# Patient Record
Sex: Male | Born: 1975 | Race: White | Hispanic: Yes | Marital: Single | Smoking: Current every day smoker
Health system: Southern US, Community
[De-identification: ages and names within clinical notes are randomized; demographics above are authoritative.]

## PROBLEM LIST (undated history)

## (undated) DIAGNOSIS — Z9289 Personal history of other medical treatment: Secondary | ICD-10-CM

## (undated) DIAGNOSIS — W3400XA Accidental discharge from unspecified firearms or gun, initial encounter: Secondary | ICD-10-CM

## (undated) DIAGNOSIS — F419 Anxiety disorder, unspecified: Secondary | ICD-10-CM

## (undated) DIAGNOSIS — N390 Urinary tract infection, site not specified: Secondary | ICD-10-CM

---

## 1999-06-25 ENCOUNTER — Ambulatory Visit (HOSPITAL_COMMUNITY): Admission: RE | Admit: 1999-06-25 | Discharge: 1999-06-25 | Payer: Self-pay | Admitting: Urology

## 1999-06-25 ENCOUNTER — Encounter: Payer: Self-pay | Admitting: Urology

## 2001-04-02 ENCOUNTER — Ambulatory Visit (HOSPITAL_COMMUNITY): Admission: RE | Admit: 2001-04-02 | Discharge: 2001-04-02 | Payer: Self-pay | Admitting: Family Medicine

## 2001-04-02 ENCOUNTER — Encounter: Payer: Self-pay | Admitting: Family Medicine

## 2003-09-04 ENCOUNTER — Encounter: Payer: Self-pay | Admitting: Emergency Medicine

## 2003-09-04 ENCOUNTER — Emergency Department (HOSPITAL_COMMUNITY): Admission: EM | Admit: 2003-09-04 | Discharge: 2003-09-04 | Payer: Self-pay | Admitting: Emergency Medicine

## 2009-08-07 ENCOUNTER — Emergency Department (HOSPITAL_COMMUNITY): Admission: EM | Admit: 2009-08-07 | Discharge: 2009-08-07 | Payer: Self-pay | Admitting: Emergency Medicine

## 2009-12-30 DIAGNOSIS — W3400XA Accidental discharge from unspecified firearms or gun, initial encounter: Secondary | ICD-10-CM

## 2009-12-30 DIAGNOSIS — Z9289 Personal history of other medical treatment: Secondary | ICD-10-CM

## 2009-12-30 HISTORY — DX: Personal history of other medical treatment: Z92.89

## 2009-12-30 HISTORY — PX: SMALL INTESTINE SURGERY: SHX150

## 2009-12-30 HISTORY — DX: Accidental discharge from unspecified firearms or gun, initial encounter: W34.00XA

## 2010-06-29 ENCOUNTER — Encounter (INDEPENDENT_AMBULATORY_CARE_PROVIDER_SITE_OTHER): Payer: Self-pay

## 2010-06-29 ENCOUNTER — Inpatient Hospital Stay (HOSPITAL_COMMUNITY): Admission: AC | Admit: 2010-06-29 | Discharge: 2010-07-09 | Payer: Self-pay

## 2010-09-19 ENCOUNTER — Emergency Department (HOSPITAL_COMMUNITY): Admission: EM | Admit: 2010-09-19 | Discharge: 2010-09-19 | Payer: Self-pay | Admitting: Emergency Medicine

## 2011-03-14 LAB — URINALYSIS, ROUTINE W REFLEX MICROSCOPIC
Nitrite: NEGATIVE
Protein, ur: NEGATIVE mg/dL
pH: 5 (ref 5.0–8.0)

## 2011-03-17 LAB — TYPE AND SCREEN
ABO/RH(D): O POS
Antibody Screen: NEGATIVE

## 2011-03-17 LAB — POCT I-STAT 7, (LYTES, BLD GAS, ICA,H+H)
Bicarbonate: 23.9 meq/L (ref 20.0–24.0)
Calcium, Ion: 1.07 mmol/L — ABNORMAL LOW (ref 1.12–1.32)
Hemoglobin: 13.3 g/dL (ref 13.0–17.0)
Potassium: 2.8 meq/L — ABNORMAL LOW (ref 3.5–5.1)
Sodium: 140 meq/L (ref 135–145)
TCO2: 25 mmol/L (ref 0–100)
pCO2 arterial: 48.9 mmHg — ABNORMAL HIGH (ref 35.0–45.0)
pH, Arterial: 7.295 — ABNORMAL LOW (ref 7.350–7.450)

## 2011-03-17 LAB — BLOOD GAS, ARTERIAL
Drawn by: 32752
FIO2: 100 %
Patient temperature: 98.6
TCO2: 27.2 mmol/L (ref 0–100)
pH, Arterial: 7.392 (ref 7.350–7.450)

## 2011-03-17 LAB — CBC
HCT: 30.6 % — ABNORMAL LOW (ref 39.0–52.0)
HCT: 31.5 % — ABNORMAL LOW (ref 39.0–52.0)
HCT: 34.3 % — ABNORMAL LOW (ref 39.0–52.0)
HCT: 37 % — ABNORMAL LOW (ref 39.0–52.0)
HCT: 45.2 % (ref 39.0–52.0)
Hemoglobin: 10.4 g/dL — ABNORMAL LOW (ref 13.0–17.0)
Hemoglobin: 11.6 g/dL — ABNORMAL LOW (ref 13.0–17.0)
Hemoglobin: 12.9 g/dL — ABNORMAL LOW (ref 13.0–17.0)
Hemoglobin: 14.3 g/dL (ref 13.0–17.0)
MCH: 31.7 pg (ref 26.0–34.0)
MCH: 32.6 pg (ref 26.0–34.0)
MCHC: 34.8 g/dL (ref 30.0–36.0)
MCV: 91.2 fL (ref 78.0–100.0)
MCV: 92.4 fL (ref 78.0–100.0)
MCV: 92.9 fL (ref 78.0–100.0)
MCV: 93.4 fL (ref 78.0–100.0)
Platelets: 172 10*3/uL (ref 150–400)
Platelets: 242 10*3/uL (ref 150–400)
Platelets: 296 10*3/uL (ref 150–400)
RBC: 4.37 MIL/uL (ref 4.22–5.81)
RDW: 12.3 % (ref 11.5–15.5)
RDW: 12.4 % (ref 11.5–15.5)
RDW: 12.5 % (ref 11.5–15.5)
RDW: 12.9 % (ref 11.5–15.5)
WBC: 12.7 10*3/uL — ABNORMAL HIGH (ref 4.0–10.5)
WBC: 14.4 10*3/uL — ABNORMAL HIGH (ref 4.0–10.5)
WBC: 4 10*3/uL (ref 4.0–10.5)
WBC: 6.9 10*3/uL (ref 4.0–10.5)
WBC: 7.7 10*3/uL (ref 4.0–10.5)
WBC: 7.9 10*3/uL (ref 4.0–10.5)

## 2011-03-17 LAB — COMPREHENSIVE METABOLIC PANEL
AST: 41 U/L — ABNORMAL HIGH (ref 0–37)
BUN: 15 mg/dL (ref 6–23)
CO2: 19 mEq/L (ref 19–32)
Calcium: 8.4 mg/dL (ref 8.4–10.5)
Glucose, Bld: 232 mg/dL — ABNORMAL HIGH (ref 70–99)
Total Protein: 6.4 g/dL (ref 6.0–8.3)

## 2011-03-17 LAB — BASIC METABOLIC PANEL
BUN: 14 mg/dL (ref 6–23)
BUN: 6 mg/dL (ref 6–23)
BUN: 7 mg/dL (ref 6–23)
BUN: 7 mg/dL (ref 6–23)
BUN: 9 mg/dL (ref 6–23)
CO2: 19 mEq/L (ref 19–32)
CO2: 27 mEq/L (ref 19–32)
Calcium: 7.6 mg/dL — ABNORMAL LOW (ref 8.4–10.5)
Calcium: 8.6 mg/dL (ref 8.4–10.5)
Chloride: 107 mEq/L (ref 96–112)
Chloride: 108 mEq/L (ref 96–112)
Chloride: 95 mEq/L — ABNORMAL LOW (ref 96–112)
Creatinine, Ser: 0.53 mg/dL (ref 0.4–1.5)
Creatinine, Ser: 0.67 mg/dL (ref 0.4–1.5)
Creatinine, Ser: 0.68 mg/dL (ref 0.4–1.5)
Creatinine, Ser: 0.92 mg/dL (ref 0.4–1.5)
GFR calc non Af Amer: >60 mL/min (ref >60)
GFR calc non Af Amer: >60 mL/min (ref >60)
GFR calc non Af Amer: >60 mL/min (ref >60)
GFR calc non Af Amer: >60 mL/min (ref >60)
Glucose, Bld: 111 mg/dL — ABNORMAL HIGH (ref 70–99)
Potassium: 3.4 mEq/L — ABNORMAL LOW (ref 3.5–5.1)
Potassium: 3.8 mEq/L (ref 3.5–5.1)
Potassium: 3.9 mEq/L (ref 3.5–5.1)
Potassium: 4.3 mEq/L (ref 3.5–5.1)
Sodium: 132 mEq/L — ABNORMAL LOW (ref 135–145)
Sodium: 138 mEq/L (ref 135–145)

## 2011-03-17 LAB — POCT I-STAT 3, ART BLOOD GAS (G3+)
Acid-base deficit: 6 mmol/L — ABNORMAL HIGH (ref 0.0–2.0)
Bicarbonate: 14.5 meq/L — ABNORMAL LOW (ref 20.0–24.0)
O2 Saturation: 100 %
Patient temperature: 97.8
Patient temperature: 98.8
TCO2: 21 mmol/L (ref 0–100)
pCO2 arterial: 26.3 mmHg — ABNORMAL LOW (ref 35.0–45.0)

## 2011-03-17 LAB — URINALYSIS, MICROSCOPIC ONLY
Glucose, UA: NEGATIVE mg/dL
Specific Gravity, Urine: 1.027 (ref 1.005–1.030)
Urobilinogen, UA: 0.2 mg/dL (ref 0.0–1.0)
pH: 5.5 (ref 5.0–8.0)

## 2011-03-17 LAB — POCT I-STAT, CHEM 8
Calcium, Ion: 0.99 mmol/L — ABNORMAL LOW (ref 1.12–1.32)
Creatinine, Ser: 1.3 mg/dL (ref 0.4–1.5)
HCT: 47 % (ref 39.0–52.0)
Hemoglobin: 16 g/dL (ref 13.0–17.0)
Potassium: 2.5 meq/L — CL (ref 3.5–5.1)
TCO2: 17 mmol/L (ref 0–100)

## 2011-03-17 LAB — PROTIME-INR
INR: 1.01 (ref 0.00–1.49)
Prothrombin Time: 13.2 seconds (ref 11.6–15.2)

## 2011-03-17 LAB — DIFFERENTIAL
Basophils Absolute: 0 10*3/uL (ref 0.0–0.1)
Basophils Absolute: 0.1 10*3/uL (ref 0.0–0.1)
Eosinophils Relative: 3 % (ref 0–5)
Lymphocytes Relative: 11 % — ABNORMAL LOW (ref 12–46)
Lymphocytes Relative: 17 % (ref 12–46)
Lymphs Abs: 2.2 10*3/uL (ref 0.7–4.0)
Monocytes Absolute: 1.1 10*3/uL — ABNORMAL HIGH (ref 0.1–1.0)
Monocytes Absolute: 1.1 10*3/uL — ABNORMAL HIGH (ref 0.1–1.0)
Monocytes Relative: 8 % (ref 3–12)
Monocytes Relative: 9 % (ref 3–12)
Neutro Abs: 11.3 10*3/uL — ABNORMAL HIGH (ref 1.7–7.7)

## 2011-03-17 LAB — ABO/RH: ABO/RH(D): O POS

## 2011-03-17 LAB — POCT I-STAT GLUCOSE: Glucose, Bld: 201 mg/dL — ABNORMAL HIGH (ref 70–99)

## 2011-03-17 LAB — APTT: aPTT: 26 seconds (ref 24–37)

## 2011-04-06 LAB — URINALYSIS, ROUTINE W REFLEX MICROSCOPIC
Bilirubin Urine: NEGATIVE
Hgb urine dipstick: NEGATIVE
Ketones, ur: NEGATIVE mg/dL
Nitrite: NEGATIVE
Urobilinogen, UA: 0.2 mg/dL (ref 0.0–1.0)

## 2012-02-02 ENCOUNTER — Emergency Department (HOSPITAL_COMMUNITY)
Admission: EM | Admit: 2012-02-02 | Discharge: 2012-02-02 | Disposition: A | Discharge status: Home / Self Care | Payer: Self-pay | Attending: Emergency Medicine | Admitting: Emergency Medicine

## 2012-02-02 ENCOUNTER — Encounter (HOSPITAL_COMMUNITY): Payer: Self-pay

## 2012-02-02 DIAGNOSIS — R3915 Urgency of urination: Secondary | ICD-10-CM | POA: Insufficient documentation

## 2012-02-02 DIAGNOSIS — R109 Unspecified abdominal pain: Secondary | ICD-10-CM | POA: Insufficient documentation

## 2012-02-02 DIAGNOSIS — M549 Dorsalgia, unspecified: Secondary | ICD-10-CM | POA: Insufficient documentation

## 2012-02-02 DIAGNOSIS — N419 Inflammatory disease of prostate, unspecified: Secondary | ICD-10-CM | POA: Insufficient documentation

## 2012-02-02 DIAGNOSIS — R350 Frequency of micturition: Secondary | ICD-10-CM | POA: Insufficient documentation

## 2012-02-02 DIAGNOSIS — R300 Dysuria: Secondary | ICD-10-CM | POA: Insufficient documentation

## 2012-02-02 HISTORY — DX: Urinary tract infection, site not specified: N39.0

## 2012-02-02 HISTORY — DX: Accidental discharge from unspecified firearms or gun, initial encounter: W34.00XA

## 2012-02-02 LAB — URINALYSIS, ROUTINE W REFLEX MICROSCOPIC
Bilirubin Urine: NEGATIVE
Glucose, UA: NEGATIVE mg/dL
Ketones, ur: 40 mg/dL — AB
Leukocytes, UA: NEGATIVE
Nitrite: NEGATIVE
Specific Gravity, Urine: 1.023 (ref 1.005–1.030)
pH: 6.5 (ref 5.0–8.0)

## 2012-02-02 MED ORDER — DOXYCYCLINE HYCLATE 100 MG PO CAPS: 100.0000 mg | ORAL_CAPSULE | Freq: Two times a day (BID) | ORAL | Status: AC
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
## 2012-02-02 NOTE — ED Notes (Signed)
Pt with c/o urinary freq, center of back pain, hx of UTIs in the past

## 2012-02-03 LAB — GC/CHLAMYDIA PROBE AMP, GENITAL
Chlamydia, DNA Probe: NEGATIVE
GC Probe Amp, Genital: NEGATIVE

## 2014-04-09 ENCOUNTER — Emergency Department (HOSPITAL_COMMUNITY)
Admission: EM | Admit: 2014-04-09 | Discharge: 2014-04-09 | Disposition: A | Discharge status: Home / Self Care | Payer: BC Managed Care – PPO | Attending: Emergency Medicine | Admitting: Emergency Medicine

## 2014-04-09 ENCOUNTER — Encounter (HOSPITAL_COMMUNITY): Payer: Self-pay | Admitting: Emergency Medicine

## 2014-04-09 DIAGNOSIS — R519 Headache, unspecified: Secondary | ICD-10-CM

## 2014-04-09 DIAGNOSIS — R1012 Left upper quadrant pain: Secondary | ICD-10-CM | POA: Insufficient documentation

## 2014-04-09 DIAGNOSIS — Z87440 Personal history of urinary (tract) infections: Secondary | ICD-10-CM | POA: Insufficient documentation

## 2014-04-09 DIAGNOSIS — R51 Headache: Secondary | ICD-10-CM | POA: Insufficient documentation

## 2014-04-09 DIAGNOSIS — R110 Nausea: Secondary | ICD-10-CM | POA: Insufficient documentation

## 2014-04-09 DIAGNOSIS — F17200 Nicotine dependence, unspecified, uncomplicated: Secondary | ICD-10-CM | POA: Insufficient documentation

## 2014-04-09 DIAGNOSIS — Z9889 Other specified postprocedural states: Secondary | ICD-10-CM | POA: Insufficient documentation

## 2014-04-09 LAB — COMPREHENSIVE METABOLIC PANEL
ALT: 13 U/L (ref 0–53)
AST: 14 U/L (ref 0–37)
Albumin: 4.1 g/dL (ref 3.5–5.2)
Alkaline Phosphatase: 138 U/L — ABNORMAL HIGH (ref 39–117)
BILIRUBIN TOTAL: 0.6 mg/dL (ref 0.3–1.2)
BUN: 12 mg/dL (ref 6–23)
CHLORIDE: 102 meq/L (ref 96–112)
CO2: 24 meq/L (ref 19–32)
Calcium: 8.8 mg/dL (ref 8.4–10.5)
Creatinine, Ser: 0.73 mg/dL (ref 0.50–1.35)
GFR calc Af Amer: >90 mL/min (ref >90)
Glucose, Bld: 88 mg/dL (ref 70–99)
Potassium: 3.8 mEq/L (ref 3.7–5.3)
Sodium: 141 mEq/L (ref 137–147)
Total Protein: 7.4 g/dL (ref 6.0–8.3)

## 2014-04-09 LAB — CBC WITH DIFFERENTIAL/PLATELET
Basophils Absolute: 0 10*3/uL (ref 0.0–0.1)
Basophils Relative: 0 % (ref 0–1)
Eosinophils Absolute: 0.5 10*3/uL (ref 0.0–0.7)
Eosinophils Relative: 5 % (ref 0–5)
HEMATOCRIT: 43.4 % (ref 39.0–52.0)
Hemoglobin: 15.8 g/dL (ref 13.0–17.0)
LYMPHS PCT: 27 % (ref 12–46)
Lymphs Abs: 2.5 10*3/uL (ref 0.7–4.0)
MCH: 31.8 pg (ref 26.0–34.0)
MCHC: 36.4 g/dL — AB (ref 30.0–36.0)
MCV: 87.3 fL (ref 78.0–100.0)
MONO ABS: 0.7 10*3/uL (ref 0.1–1.0)
Monocytes Relative: 8 % (ref 3–12)
NEUTROS ABS: 5.5 10*3/uL (ref 1.7–7.7)
Neutrophils Relative %: 60 % (ref 43–77)
Platelets: 265 10*3/uL (ref 150–400)
RBC: 4.97 MIL/uL (ref 4.22–5.81)
RDW: 12.4 % (ref 11.5–15.5)
WBC: 9.2 10*3/uL (ref 4.0–10.5)

## 2014-04-09 LAB — LIPASE, BLOOD: LIPASE: 19 U/L (ref 11–59)

## 2014-04-09 MED ORDER — SODIUM CHLORIDE 0.9 % IV BOLUS (SEPSIS)
1000.0000 mL | Freq: Once | INTRAVENOUS | Status: AC
  Administered 2014-04-09: 1000 mL via INTRAVENOUS

## 2014-04-09 MED ORDER — METOCLOPRAMIDE HCL 5 MG/ML IJ SOLN
10.0000 mg | Freq: Once | INTRAMUSCULAR | Status: AC
  Administered 2014-04-09: 10 mg via INTRAVENOUS
  Filled 2014-04-09: qty 2

## 2014-04-09 MED ORDER — DIPHENHYDRAMINE HCL 50 MG/ML IJ SOLN
25.0000 mg | Freq: Once | INTRAMUSCULAR | Status: AC
  Administered 2014-04-09: 25 mg via INTRAVENOUS
  Filled 2014-04-09: qty 1

## 2014-04-09 MED ORDER — KETOROLAC TROMETHAMINE 30 MG/ML IJ SOLN
30.0000 mg | Freq: Once | INTRAMUSCULAR | Status: AC
  Administered 2014-04-09: 30 mg via INTRAVENOUS
  Filled 2014-04-09: qty 1

## 2014-04-09 NOTE — ED Notes (Signed)
Use of interpreter phone service. Pt c/o 15 days ago with a cold. Pain to sinus area and right side of head and in the front. Pain in abdomen area that occurs after eating. Reports feeling like he is bloated, ongoing for a long time. Pt denies nausea or vomiting.

## 2014-04-09 NOTE — ED Notes (Signed)
Pt states sinus pressure and cold symptoms, also states headache. C/o diffuse abdominal pain after eating. 3/10 pain at the time. Pt is alert and oriented x4. Respirations unlabored. Pt does not speak english, interpreter phone used to perform assessment. Ambulatory to exam room.

## 2014-04-09 NOTE — ED Notes (Signed)
Pt states he feels much better. Denies pain at the time. Pt is alert and oriented x4. No signs of distress noted.

## 2014-04-09 NOTE — Discharge Instructions (Signed)
Please use resources below to find a primary care provider for further management of your medical condition.  Return to ER if your symptoms worsen or if you have other concerns.   Dolor abdominal en adultos (Abdominal Pain, Adult) El dolor puede tener muchas causas. Normalmente la causa del dolor abdominal no es una enfermedad y Scientist, clinical (histocompatibility and immunogenetics) sin TEFL teacher. Frecuentemente puede controlarse y tratarse en casa. Su mdico le Medical sales representative examen fsico y posiblemente solicite anlisis de sangre y radiografas para ayudar a Chief Strategy Officer la gravedad de su dolor. Sin embargo, en [employer], debe transcurrir ms tiempo antes de que se pueda Clinical research associate una causa evidente del dolor. Antes de llegar a ese punto, es posible que su mdico no sepa si necesita ms pruebas o un tratamiento ms profundo. INSTRUCCIONES PARA EL CUIDADO EN EL HOGAR  Est atento al dolor para ver si hay cambios. Las siguientes indicaciones ayudarn a Architectural technologist que pueda sentir:  [place] solo medicamentos de venta libre o recetados, segn las indicaciones del mdico.  No tome laxantes a menos que se lo haya indicado su mdico.  Pruebe con Neomia Dear dieta lquida absoluta (caldo, t o agua) segn se lo indique su mdico. Introduzca gradualmente una dieta normal, segn su tolerancia. SOLICITE ATENCIN MDICA SI:  Tiene dolor abdominal sin explicacin.  Tiene dolor abdominal relacionado con nuseas o diarrea.  Tiene dolor cuando orina o defeca.  Experimenta dolor abdominal que lo despierta de noche.  Tiene dolor abdominal que empeora o mejora cuando come alimentos.  Tiene dolor abdominal que empeora cuando come alimentos grasosos. SOLICITE ATENCIN MDICA DE INMEDIATO SI:   El dolor no desaparece en un plazo mximo de 2horas.  Tiene fiebre.  No deja de (vomitar).  El Engineer, mining se siente solo en partes del abdomen, como el lado derecho o la parte inferior izquierda del abdomen.  Evaca materia fecal sanguinolenta o negra,  de aspecto alquitranado. ASEGRESE DE QUE:  Comprende estas instrucciones.  Controlar su afeccin.  Recibir ayuda de inmediato si no mejora o si empeora. Document Released: 12/16/2005 Document Revised: 10/06/2013 [facility] Patient Information 2014 [place], [place].   Emergency Department Resource Guide 1) Find a Doctor and Pay Out of Pocket Although you won't have to find out who is covered by your insurance plan, it is a good idea to ask around and get recommendations. You will then need to call the office and see if the doctor you have chosen will accept you as a new patient and what types of options they offer for patients who are self-pay. Some doctors offer discounts or will set up payment plans for their patients who do not have insurance, but you will need to ask so you aren't surprised when you get to your appointment.  2) Contact Your Local Health Department Not all health departments have doctors that can see patients for sick visits, but many do, so it is worth a call to see if yours does. If you don't know where your local health department is, you can check in your phone book. The CDC also has a tool to help you locate your state's health department, and many state websites also have listings of all of their local health departments.  3) Find a Walk-in Clinic If your illness is not likely to be very severe or complicated, you may want to try a walk in clinic. These are popping up all over the country in pharmacies, drugstores, and shopping centers. They're usually staffed by nurse practitioners or physician assistants that  have been trained to treat common illnesses and complaints. They're usually fairly quick and inexpensive. However, if you have serious medical issues or chronic medical problems, these are probably not your best option.  No Primary Care Doctor: - Call Health Connect at  [phone] - they can help you locate a primary care doctor that  accepts your insurance,  provides certain services, etc. - Physician Referral Service- [phone]  Chronic Pain Problems: Organization         Address  Phone   Notes  [facility]  [phone] Patients need to be referred by their primary care doctor.   Medication Assistance: Organization         Address  Phone   Notes  [facility] Medication [facility] [address] [place] [place], [place] [address] [phone] --Must be a resident of [facility] -- Must have NO insurance coverage whatsoever (no Medicaid/ Medicare, etc.) -- The pt. MUST have a primary care doctor that directs their care regularly and follows them in the community   Med[facility]ist  [phone]   [employer]  [phone]    Agencies that provide inexpensive medical care: Organization         Address  Phone   Notes  [facility]  [phone]   [facility]    [phone]   [facility] [address] [place], [place] [address] [phone]   [facility] [address] [place] [place] [phone]   Planned Parenthood    [phone]   [facility]    [phone]   [facility]  [address], [place] Phone:  [phone], Fax:  [phone] Hours of Operation:  9 am - 6 pm, M-F.  Also accepts Medicaid/Medicare and self-pay.  [facility]  [address], [place] Phone: [phone], Fax: [phone]. Hours of Operation:  8:30 am - 5:30 pm, M-F.  Also accepts Medicaid and self-pay.  [facility] [place] [address], [place] Phone: [phone]   [facility] [address] Natasha Bence [place], [place] [phone], Ext. [phone] Mondays & Thursdays: 7-9 AM.  First 15 patients are seen on a first come, first serve basis.    Medicaid-accepting [facility] Providers:  Organization         Address  Phone    Notes  [facility] [address], [place] [phone] Also accepts self-pay patients.  [facility] [address] Laurell Josephs [place], [place]  [phone]   [facility] [address], [place] [phone]   [facility] [address], [place] [phone]   Renaye Rakers [address], [place]   [phone] Only accepts [place] Access [place] patients after they have their name applied to their card.   Self-Pay (no insurance) in [facility]:  Organization         Address  Phone   Notes  Sickle Cell Patients, [facility] [address] [place], [place] [phone]   [facility] [address] [place], [place] [phone]   [facility] [place]  [address], [place] [phone]   [facility]/Dr. Osei-Bonsu  [address] [place] [address], [place]  or [address], [address], [place] [phone] Phone number for both [facility] and [place] locations is the same.  [facility]gent Medical and [facility] [address], [place] [phone]   [facility] [address], [place] or [address] [phone]   [facility] [address] [place] [phone], phone; [phone], fax Sees patients 1st and 3rd Saturday of every month.  Must not qualify for public or private insurance (i.e. Medicaid, Medicare, [place] Health Choice, Veterans' Benefits)  Household income should be no more than 200% of the poverty level The clinic cannot treat you if you are pregnant or think you are pregnant  Sexually transmitted diseases are not treated at the clinic.    Dental Care: Organization         Address  Phone  Notes  [facility] Department of [facility] [address] [place], [place] [phone] Accepts children up to age 51 who are enrolled in [place] or [place] Health Choice; pregnant women with a Medicaid card; and children who have applied for Medicaid or [place] Health Choice, but were declined, whose parents can pay a reduced fee at time of service.  [facility] Department of [facility]  [address], [place] [phone] Accepts children up to age 19 who are enrolled in [place] or [place] Health Choice; pregnant women with a Medicaid card; and children who have applied for Medicaid or [place] Health Choice, but were declined, whose parents can pay a reduced fee at time of service.  [facility]  [address] [place], [place] [phone] Patients are seen by appointment only. Walk-ins are not accepted. [facility] will see patients 75 years of age and older. Monday - Tuesday (8am-5pm) Most Wednesdays (8:30-5pm) $30 per visit, cash only  [facility] Adult Dental Access PRO[facility]  [address], [facility] [phone] Patients are seen by appointment only. Walk-ins are not accepted. [facility] will see patients 75 years of age and older. One Wednesday Evening (Monthly: Volunteer Based).  $30 per visit, cash only  [employer]  [phone] for adults; Children under age 63, call Graduate Pediatric Dentistry at [phone]. Children aged 24-14, please call [phone] to request a pediatric application.  Dental services are provided in all areas of dental care including fillings, crowns and bridges, complete and partial dentures, implants, gum treatment, root canals, and extractions. Preventive care is also provided. Treatment is provided to both adults and children. Patients are selected via a lottery and there is often a waiting list.   [facility] [address], [place]  [phone] [email or web address]   [facility] [address] [place], [place] [phone], Ext.  [phone] Second and Fourth Thursday of each month, opens at 6:30 AM; Clinic ends at 9 AM.  Patients are seen on a first-come first-served basis, and a limited number are seen during each clinic.   [facility]  [address] Ether Griffins [place], [place] [phone]   Eligibility Requirements You must have lived in [place], or [place] counties for at least the last three months.   You cannot be eligible for state or federal sponsored [place], including [employer], [place], or [employer].   You generally cannot be eligible for healthcare insurance through your employer.    How to apply: Eligibility  screenings are held every Tuesday and Wednesday afternoon from 1:00 pm until 4:00 pm. You do not need an appointment for the interview!  [facility] [address] [place] [address], [place], [place] [phone]   [facility]  [phone]   [facility]  [phone]   [facility]  [phone]    Behavioral Health Resources in the Community: Intensive Outpatient Programs Organization         Address  Phone  Notes  [facility] [address], [place], [place] [phone]   [facility] [address], [place], [place] [phone]   [facility] [address], [place], [place]  [phone]   [facility] [address],  [place], [place] [phone] or [phone]   Substance Abuse Resources Organization         Address  Phone  Notes  [facility]  [phone]   [facility]  [phone]   [facility]  [phone]   [facility]  [phone]   Residential & Outpatient Substance Abuse Program  [phone]   Psychological Services Organization         Address  Phone  Notes  [facility]  [phone]   [facility]  [phone]- [phone]   [facility]ental [facility] [address], [place] [phone] or [phone]    Mobile Crisis Teams Organization         Address  Phone  Notes  [facility], Mobile Crisis Care Unit  [phone]   [facility]  [address] [place], [place] [phone]   Doristine LocksSharon DeEsch [address] [place] [phone]    Self-Help/Support Groups Organization         Address  Phone             Notes  [facility] [place] - variety of support groups  [phone]- [phone] Call for more information  Narcotics Anonymous (NA), [facility] [address], [place]  2 meetings at this location   Statisticianesidential Treatment Programs Organization         Address  Phone  Notes  [facility] [address],    [place]  [phone]   [facility]  [address], [place] [phone], [place], [place] [phone]   [facility] [address], [place] [phone] Admissions: 8am-3pm M-F  [facility] [address]    [place], [place] [phone]   [facility] [address], [place], [place] [phone]   [facility] [address]  [place], [place] [phone]   [facility] - Intensive Outpatient [address], [place], [place] [phone]   [facility] ([facility] [address]  [place], [place] [phone] or [phone]   [facility] ([facility]) [address] [place], [place] [phone] Accepts Medicaid  [facility] [address]  [place] [phone] Substance Abuse/Addiction Treatment   [facility] Resources Organization         Address  Phone  Notes  [facility]  [phone]) [phone]   Angie FavaJulie Brannon, PhD [address], [address] [place], [place]   [phone]) [phone] or [phone]   [facility]   [address] [place], [place] [phone](336) [phone]   [facility] [address], [place], [place] [phone] Insurance/Medicaid/sponsorship through [facility] and Families [address] [place], [place] [phone] [place], [place] [phone]    Dr. Adele Schilder  [phone]   [facility] Dept. 1) [address], [place] 2) [place] 3)  [place] [address], [place] [phone]  [phone]   [place] [phone] or [phone] (After Hours)
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
## 2014-04-14 NOTE — ED Provider Notes (Signed)
Medical screening examination/treatment/procedure(s) were performed by non-physician practitioner and as supervising physician I was immediately available for consultation/collaboration.   EKG Interpretation None        Candyce ChurnJohn David Nivea Wojdyla III, MD 04/14/14 [phone]

## 2014-12-29 ENCOUNTER — Inpatient Hospital Stay (HOSPITAL_COMMUNITY)
Admission: EM | Admit: 2014-12-29 | Discharge: 2015-01-01 | DRG: 390 | Disposition: A | Discharge status: Home / Self Care | Payer: BC Managed Care – PPO | Attending: General Surgery | Admitting: General Surgery

## 2014-12-29 ENCOUNTER — Emergency Department (HOSPITAL_COMMUNITY): Payer: BC Managed Care – PPO

## 2014-12-29 ENCOUNTER — Inpatient Hospital Stay (HOSPITAL_COMMUNITY): Payer: BC Managed Care – PPO

## 2014-12-29 ENCOUNTER — Encounter (HOSPITAL_COMMUNITY): Payer: Self-pay | Admitting: Emergency Medicine

## 2014-12-29 DIAGNOSIS — F17210 Nicotine dependence, cigarettes, uncomplicated: Secondary | ICD-10-CM | POA: Diagnosis present

## 2014-12-29 DIAGNOSIS — K565 Intestinal adhesions [bands], unspecified as to partial versus complete obstruction: Secondary | ICD-10-CM

## 2014-12-29 DIAGNOSIS — K56609 Unspecified intestinal obstruction, unspecified as to partial versus complete obstruction: Secondary | ICD-10-CM | POA: Diagnosis present

## 2014-12-29 DIAGNOSIS — R109 Unspecified abdominal pain: Secondary | ICD-10-CM | POA: Diagnosis not present

## 2014-12-29 HISTORY — DX: Personal history of other medical treatment: Z92.89

## 2014-12-29 HISTORY — DX: Anxiety disorder, unspecified: F41.9

## 2014-12-29 LAB — CBC WITH DIFFERENTIAL/PLATELET
BASOS ABS: 0 10*3/uL (ref 0.0–0.1)
Basophils Relative: 0 % (ref 0–1)
EOS ABS: 0 10*3/uL (ref 0.0–0.7)
Eosinophils Relative: 0 % (ref 0–5)
HCT: 47.3 % (ref 39.0–52.0)
Hemoglobin: 17.6 g/dL — ABNORMAL HIGH (ref 13.0–17.0)
Lymphocytes Relative: 12 % (ref 12–46)
Lymphs Abs: 1.2 10*3/uL (ref 0.7–4.0)
MCH: 31.8 pg (ref 26.0–34.0)
MCHC: 31.8 g/dL (ref 30.0–36.0)
MCV: 85.5 fL (ref 78.0–100.0)
Monocytes Absolute: 0.8 10*3/uL (ref 0.1–1.0)
Monocytes Relative: 7 % (ref 3–12)
NEUTROS ABS: 8.3 10*3/uL — AB (ref 1.7–7.7)
NEUTROS PCT: 81 % — AB (ref 43–77)
Platelets: 253 10*3/uL (ref 150–400)
RBC: 5.53 MIL/uL (ref 4.22–5.81)
RDW: 12.3 % (ref 11.5–15.5)
WBC: 10.3 10*3/uL (ref 4.0–10.5)

## 2014-12-29 LAB — COMPREHENSIVE METABOLIC PANEL
ALT: 21 U/L (ref 0–53)
AST: 24 U/L (ref 0–37)
Albumin: 4.3 g/dL (ref 3.5–5.2)
Alkaline Phosphatase: 100 U/L (ref 39–117)
Anion gap: 10 (ref 5–15)
BILIRUBIN TOTAL: 0.7 mg/dL (ref 0.3–1.2)
BUN: 10 mg/dL (ref 6–23)
CHLORIDE: 105 meq/L (ref 96–112)
CO2: 22 mmol/L (ref 19–32)
Calcium: 9.4 mg/dL (ref 8.4–10.5)
Creatinine, Ser: 0.72 mg/dL (ref 0.50–1.35)
GFR calc Af Amer: >90 mL/min (ref >90)
GFR calc non Af Amer: >90 mL/min (ref >90)
Glucose, Bld: 124 mg/dL — ABNORMAL HIGH (ref 70–99)
POTASSIUM: 3.6 mmol/L (ref 3.5–5.1)
Sodium: 137 mmol/L (ref 135–145)
TOTAL PROTEIN: 7.3 g/dL (ref 6.0–8.3)

## 2014-12-29 LAB — URINALYSIS, ROUTINE W REFLEX MICROSCOPIC
BILIRUBIN URINE: NEGATIVE
Glucose, UA: NEGATIVE mg/dL
Hgb urine dipstick: NEGATIVE
Ketones, ur: 15 mg/dL — AB
LEUKOCYTES UA: NEGATIVE
NITRITE: NEGATIVE
PH: 7 (ref 5.0–8.0)
Protein, ur: NEGATIVE mg/dL
SPECIFIC GRAVITY, URINE: 1.03 (ref 1.005–1.030)
UROBILINOGEN UA: 0.2 mg/dL (ref 0.0–1.0)

## 2014-12-29 LAB — LIPASE, BLOOD: LIPASE: 25 U/L (ref 11–59)

## 2014-12-29 MED ORDER — DIPHENHYDRAMINE HCL 12.5 MG/5ML PO ELIX: 12.5000 mg | ORAL_SOLUTION | Freq: Four times a day (QID) | ORAL | Status: DC | PRN

## 2014-12-29 MED ORDER — IOHEXOL 300 MG/ML  SOLN
80.0000 mL | Freq: Once | INTRAMUSCULAR | Status: AC | PRN
  Administered 2014-12-29: 80 mL via INTRAVENOUS

## 2014-12-29 MED ORDER — HYDROMORPHONE HCL 1 MG/ML IJ SOLN
1.0000 mg | Freq: Once | INTRAMUSCULAR | Status: AC
  Administered 2014-12-29: 1 mg via INTRAVENOUS
  Filled 2014-12-29: qty 1

## 2014-12-29 MED ORDER — ONDANSETRON HCL 4 MG/2ML IJ SOLN
4.0000 mg | Freq: Once | INTRAMUSCULAR | Status: AC
  Administered 2014-12-29: 4 mg via INTRAVENOUS
  Filled 2014-12-29: qty 2

## 2014-12-29 MED ORDER — ACETAMINOPHEN 650 MG RE SUPP: 650.0000 mg | Freq: Four times a day (QID) | RECTAL | Status: DC | PRN

## 2014-12-29 MED ORDER — METOCLOPRAMIDE HCL 5 MG/ML IJ SOLN
10.0000 mg | Freq: Once | INTRAMUSCULAR | Status: AC
  Administered 2014-12-29: 10 mg via INTRAMUSCULAR
  Filled 2014-12-29: qty 2

## 2014-12-29 MED ORDER — IOHEXOL 300 MG/ML  SOLN
25.0000 mL | Freq: Once | INTRAMUSCULAR | Status: AC | PRN
  Administered 2014-12-29: 25 mL via ORAL

## 2014-12-29 MED ORDER — INFLUENZA VAC SPLIT QUAD 0.5 ML IM SUSY
0.5000 mL | PREFILLED_SYRINGE | INTRAMUSCULAR | Status: DC
Start: 2014-12-30 — End: 2015-01-01
  Filled 2014-12-29: qty 0.5

## 2014-12-29 MED ORDER — ACETAMINOPHEN 325 MG PO TABS
650.0000 mg | ORAL_TABLET | Freq: Four times a day (QID) | ORAL | Status: DC | PRN
  Administered 2014-12-30: 650 mg via ORAL
  Filled 2014-12-29: qty 2

## 2014-12-29 MED ORDER — SODIUM CHLORIDE 0.9 % IV SOLN
25.0000 mg | Freq: Four times a day (QID) | INTRAVENOUS | Status: DC | PRN
  Administered 2014-12-29: 25 mg via INTRAVENOUS
  Filled 2014-12-29 (×3): qty 1

## 2014-12-29 MED ORDER — MORPHINE SULFATE 4 MG/ML IJ SOLN
4.0000 mg | Freq: Once | INTRAMUSCULAR | Status: AC
  Administered 2014-12-29: 4 mg via INTRAVENOUS
  Filled 2014-12-29: qty 1

## 2014-12-29 MED ORDER — SODIUM CHLORIDE 0.9 % IV BOLUS (SEPSIS)
1000.0000 mL | Freq: Once | INTRAVENOUS | Status: AC
  Administered 2014-12-29: 1000 mL via INTRAVENOUS

## 2014-12-29 MED ORDER — ENOXAPARIN SODIUM 40 MG/0.4ML ~~LOC~~ SOLN
40.0000 mg | SUBCUTANEOUS | Status: DC
  Administered 2014-12-29 – 2014-12-31 (×2): 40 mg via SUBCUTANEOUS
  Filled 2014-12-29 (×5): qty 0.4

## 2014-12-29 MED ORDER — MORPHINE SULFATE 2 MG/ML IJ SOLN
1.0000 mg | INTRAMUSCULAR | Status: DC | PRN
  Administered 2014-12-29 – 2014-12-30 (×6): 4 mg via INTRAVENOUS
  Filled 2014-12-29 (×6): qty 2

## 2014-12-29 MED ORDER — PNEUMOCOCCAL VAC POLYVALENT 25 MCG/0.5ML IJ INJ
0.5000 mL | INJECTION | INTRAMUSCULAR | Status: DC
  Filled 2014-12-29: qty 0.5

## 2014-12-29 MED ORDER — DIPHENHYDRAMINE HCL 50 MG/ML IJ SOLN
25.0000 mg | Freq: Once | INTRAMUSCULAR | Status: AC
  Administered 2014-12-29: 25 mg via INTRAVENOUS
  Filled 2014-12-29: qty 1

## 2014-12-29 MED ORDER — POTASSIUM CHLORIDE IN NACL 20-0.9 MEQ/L-% IV SOLN
INTRAVENOUS | Status: DC
  Administered 2014-12-29 – 2014-12-31 (×7): via INTRAVENOUS
  Filled 2014-12-29 (×10): qty 1000

## 2014-12-29 MED ORDER — DIPHENHYDRAMINE HCL 50 MG/ML IJ SOLN: 12.5000 mg | Freq: Four times a day (QID) | INTRAMUSCULAR | Status: DC | PRN

## 2014-12-29 MED ORDER — ONDANSETRON HCL 4 MG/2ML IJ SOLN
4.0000 mg | Freq: Four times a day (QID) | INTRAMUSCULAR | Status: DC | PRN
  Administered 2014-12-29 – 2014-12-31 (×3): 4 mg via INTRAVENOUS
  Filled 2014-12-29 (×3): qty 2
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
## 2014-12-29 NOTE — ED Notes (Signed)
Patient complains of abdominal pain and nausea with pain back pain; states vomited 9 times since yesterday.
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
## 2014-12-30 ENCOUNTER — Inpatient Hospital Stay (HOSPITAL_COMMUNITY): Payer: BC Managed Care – PPO

## 2014-12-30 LAB — CBC
HCT: 47.7 % (ref 39.0–52.0)
Hemoglobin: 16.9 g/dL (ref 13.0–17.0)
MCH: 31.2 pg (ref 26.0–34.0)
MCHC: 35.4 g/dL (ref 30.0–36.0)
MCV: 88.2 fL (ref 78.0–100.0)
Platelets: 257 10*3/uL (ref 150–400)
RBC: 5.41 MIL/uL (ref 4.22–5.81)
RDW: 12.7 % (ref 11.5–15.5)
WBC: 6.4 10*3/uL (ref 4.0–10.5)

## 2014-12-30 LAB — BASIC METABOLIC PANEL
ANION GAP: 10 (ref 5–15)
BUN: 12 mg/dL (ref 6–23)
CO2: 25 mmol/L (ref 19–32)
CREATININE: 0.87 mg/dL (ref 0.50–1.35)
Calcium: 8.4 mg/dL (ref 8.4–10.5)
Chloride: 104 mEq/L (ref 96–112)
GFR calc Af Amer: >90 mL/min (ref >90)
GFR calc non Af Amer: >90 mL/min (ref >90)
GLUCOSE: 143 mg/dL — AB (ref 70–99)
Potassium: 4.3 mmol/L (ref 3.5–5.1)
SODIUM: 139 mmol/L (ref 135–145)

## 2014-12-30 MED ORDER — CETYLPYRIDINIUM CHLORIDE 0.05 % MT LIQD
7.0000 mL | Freq: Two times a day (BID) | OROMUCOSAL | Status: DC
  Administered 2014-12-30 – 2014-12-31 (×3): 7 mL via OROMUCOSAL

## 2014-12-30 MED ORDER — CHLORHEXIDINE GLUCONATE 0.12 % MT SOLN
15.0000 mL | Freq: Two times a day (BID) | OROMUCOSAL | Status: DC
  Administered 2014-12-30 – 2015-01-01 (×5): 15 mL via OROMUCOSAL
  Filled 2014-12-30 (×5): qty 15
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
## 2015-01-01 MED ORDER — ACETAMINOPHEN 325 MG PO TABS: 650.0000 mg | ORAL_TABLET | Freq: Four times a day (QID) | ORAL | Status: DC | PRN
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
## 2015-01-01 NOTE — Discharge Instructions (Signed)
Plan de alimentacin blanda For the next week (Soft-Food Meal Plan) Un plan de alimentacin blanda incluye alimentos seguros y fciles de tragar. Este plan de alimentacin suele usarse en los siguientes casos:  Si tiene problemas para Product manager o tragar alimentos.  Como transicin despus de haber tenido un plan de alimentacin lquida durante un perodo prolongado. QU DEBO SABER ACERCA DEL PLAN DE ALIMENTACIN BLANDA? Un plan de alimentacin blanda incluye alimentos tiernos que son seguros y fciles de Product manager y Engineer, manufacturing. En la [employer], es ms fcil tragar los alimentos en bocados. Un bocado mide aproximadamente media pulgada o menos. Los alimentos de [employer] plan no necesitan molerse ni Psychologist, prison and probation services. Deben evitarse los alimentos muy duros, crujientes o pegajosos. Adems, tambin deben evitarse los panes, cereales, yogures y postres con nueces, semillas o frutas. QU ALIMENTOS PUEDO COMER? Cereales Arroz blanco y arroz integral. Pan humedecido, aderezos, pastas y fideos. Cereales cocidos o secos bien humedecidos, como la smola (trigo cocido), avena o polenta. Los bizcochos, panes, magdalenas, panqueques y waffles deben estar bien humedecidos. Vegetales [place] cortada Murphy Oil. Vegetales tiernos cocidos, incluida la papa sin piel. Jugos de vegetales. Caldos o sopas cremosas preparadas con vegetales que no sean fibrosos o gomosos. Tomates colados (sin semillas). Frutas Frutas enlatadas o bien cocidas. Frutas frescas blandas (maduras) y peladas, como duraznos, pelones, kiwi, meln cantalupo, meln roco de miel y sanda (sin semillas). Frutos secos blandos con semillas pequeas, como las frutillas. Jugos de frutas (sin pulpa). Carnes y [place] fuentes de protenas Carne [place] tierna y [place]. Carnero. Cordero. Ternera. Pollo. Pavo. Hgado. Jamn. Pescado sin espinas. Huevos. [employer] Health, bebidas a base de [place] y crema. Queso crema y requesn. Yogur  natural. Dulces/postres Gelatina saborizada. Natillas. Helado natural, yogur helado, sorbetes, batidos y [place]. Bizcochuelos y Programmer, systems. Caramelos duros comunes.  Otros [place], margarina (sin grasas trans) y aceites para cocinar. [place]. Salsas a base de crema. Condimentos no picantes, sal y azcar. Wynetta Emery, melazas, miel y [place]. Esta no es [employer] de los alimentos o las bebidas recomendados. Consulte a su nutricionista para conocer ms opciones. QU ALIMENTOS NO ESTN RECOMENDADOS? Cereales Pan seco, tostadas, galletas de agua sin humedecer. Cereales gruesos o secos, como salvado, granola y trigo triturado. Panes duros o gomosos con corteza como pan francs o baguettes. Vegetales Maz. Vegetales crudos, excepto la lechuga cortada Murphy Oil. Vegetales cocidos fibrosos o duros. Papas duras, fritas, crocantes o con piel. Nils Pyle Frutas frescas con piel o semillas o ambas, como manzanas, peras o ciruelas. Nils Pyle fibrosas y con mucha pulpa, como la papaya, la pia, el coco o el mango. Frutas deshidratadas, rollitos sabor a fruta y [employer] frutos secos. Carnes y otras fuentes de protenas [place] y perros calientes. Carnes con cartlago. Pescado con espinas. Nueces, semillas y [place] de man o nuez espesa. Dulces/postres Bizcochuelos o Teaching laboratory technician secos o gomosos.  Esta no es [employer] de los alimentos y las bebidas que Personnel officer. Consulte a su nutricionista para obtener ms informacin. Document Released: 11/28/2008 Document Revised: 12/21/2013 [facility] Patient Information 2015 [place], [place]. This information is not intended to replace advice given to you by your health care provider. Make sure you discuss any questions you have with your health care provider.  Obstruccin Del Intestino Delgado (Small Bowel Obstruction)  Una obstruccin en el intestino delgado es un bloqueo (obstruccin) presente en el intestino delgado. El intestino delgado es  un tubo largo que da muchas vueltas y Investment banker, operational el estmago con el colon. Su  funcin es absorber Museum/gallery conservator) los lquidos y alimentos y transportarlos al torrente sanguneo. CAUSAS Hay numerosas causas que originan el bloqueo. Las causas ms frecuentes son:  Hernias. Esta es una causa ms frecuente en nios que en adultos.  Enfermedad inflamatoria del intestino (enteritis y colitis).  Vlvulo (retorcimiento del intestino).  Tumores.  Tejido cicatrizal (adherencias) de [place] o terapia de radiacin previa.  Ciruga reciente. Esto puede causar una obstruccin aguda en el intestino delgado llamada leo. SNTOMAS  Dolor abdominal. Pueden ser calambres sordos o dolor agudo. Puede ocurrir en una zona o puede estar presente en todo el abdomen. El dolor puede variar de leve a intenso, dependiendo del grado de la obstruccin.  Nuseas y vmitos. El vmito puede ser verdoso o de color amarillo por la bilis.  Estmago distendido o hinchado. Un sntoma comn es la distensin abdominal.  Constipacin  Ausencia de gases.  Eructos frecuentes.  Diarrea. Esto puede ocurrir si heces lquidas pueden filtrarse por la obstruccin. DIAGNSTICO El profesional realizar el diagnstico de obstruccin a travs de la historia [place], el examen fsico y la toma de radiografas. Si la causa no es evidente, puede ser necesaria una tomografa computada de su abdomen y pelvis. TRATAMIENTO El tratamiento depende de la causa y de la gravedad del problema.   En algunos casos la obstruccin mejora simplemente con reposo en cama y lquidos por va intravenosa.  Es muy importante que relaje el intestino. Esto significa que siga una dieta simple. En ocasiones, puede ser necesario que lleve una dieta de lquidos claros [employer] Rx.  Generalmente se coloca un pequeo tubo (tubo nasogstrico) dentro del estmago para descomprimir el intestino. Cuando el intestino se bloquea, se hincha como un globo  lleno de aire y lquidos. Descompresin significa que el aire y los lquidos se eliminan con la succin que realiza el tubo. Esto puede ayudar a Glass blower/designer, Environmental health practitioner y la nusea. Tambin puede ayudar a que la obstruccin se resuelva ms rpido.  Es posible que sea necesario realizar una ciruga si los otros tratamientos no funcionan. La obstruccin intestinal causada por una hernia requiere de ciruga cuanto antes y puede llegar a ser un procedimiento de Associate Professor. Aquellas adherencias que sean causa de obstrucciones frecuentes o graves tambin pueden requerir [place]. INSTRUCCIONES PARA EL CUIDADO DOMICILIARIO Si su obstruccin es slo parcial o incompleta, es posible que se le permita regresar a Hotel manager.   Debe hacer reposo.  Siga su dieta segn las indicaciones de su mdico.  Seguir una dieta de lquidos claros hasta que el trastorno mejore.  Evite los alimentos slidos segn las indicaciones. SOLICITE ATENCIN MDICA DE INMEDIATO SI:  Dolor o calambres abdominales.  Vomita sangre.  Tiene vmitos que no puede controlar o nusea.  No puede beber lquidos debido a los vmitos o al [employer]  Confusin.  Deshidratacin (siente la piel seca o tiene mucha sed).  Observa gran hinchazn.  Tiene escalofros.  Tiene fiebre.  Le sube la fiebre, siente debilidad extrema o se desmaya. EST SEGURO QUE:  Comprende las instrucciones para el alta mdica.  Controlar su enfermedad.  Solicitar atencin mdica de inmediato segn las indicaciones. Document Released: 03/24/2008 Document Revised: 03/09/2012 [facility] Patient Information 2015 [place], [place]. This information is not intended to replace advice given to you by your health care provider. Make sure you discuss any questions you have with your health care provider.
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
## 2015-10-20 ENCOUNTER — Encounter (HOSPITAL_COMMUNITY): Payer: Self-pay | Admitting: Family Medicine

## 2015-10-20 ENCOUNTER — Emergency Department (HOSPITAL_COMMUNITY): Payer: BLUE CROSS/BLUE SHIELD

## 2015-10-20 ENCOUNTER — Emergency Department (HOSPITAL_COMMUNITY)
Admission: EM | Admit: 2015-10-20 | Discharge: 2015-10-20 | Disposition: A | Discharge status: Home / Self Care | Payer: BLUE CROSS/BLUE SHIELD | Attending: Emergency Medicine | Admitting: Emergency Medicine

## 2015-10-20 DIAGNOSIS — Y9339 Activity, other involving climbing, rappelling and jumping off: Secondary | ICD-10-CM | POA: Diagnosis not present

## 2015-10-20 DIAGNOSIS — S99912A Unspecified injury of left ankle, initial encounter: Secondary | ICD-10-CM | POA: Diagnosis present

## 2015-10-20 DIAGNOSIS — Z87440 Personal history of urinary (tract) infections: Secondary | ICD-10-CM | POA: Insufficient documentation

## 2015-10-20 DIAGNOSIS — Y929 Unspecified place or not applicable: Secondary | ICD-10-CM | POA: Diagnosis not present

## 2015-10-20 DIAGNOSIS — Y990 Civilian activity done for income or pay: Secondary | ICD-10-CM | POA: Diagnosis not present

## 2015-10-20 DIAGNOSIS — Z720 Tobacco use: Secondary | ICD-10-CM | POA: Diagnosis not present

## 2015-10-20 DIAGNOSIS — S99922A Unspecified injury of left foot, initial encounter: Secondary | ICD-10-CM | POA: Insufficient documentation

## 2015-10-20 DIAGNOSIS — S93402A Sprain of unspecified ligament of left ankle, initial encounter: Secondary | ICD-10-CM

## 2015-10-20 DIAGNOSIS — Z87828 Personal history of other (healed) physical injury and trauma: Secondary | ICD-10-CM | POA: Insufficient documentation

## 2015-10-20 DIAGNOSIS — Z8659 Personal history of other mental and behavioral disorders: Secondary | ICD-10-CM | POA: Diagnosis not present

## 2015-10-20 DIAGNOSIS — X58XXXA Exposure to other specified factors, initial encounter: Secondary | ICD-10-CM | POA: Diagnosis not present

## 2015-10-20 MED ORDER — IBUPROFEN 600 MG PO TABS: 600.0000 mg | ORAL_TABLET | Freq: Four times a day (QID) | ORAL | Status: DC | PRN

## 2015-10-20 NOTE — Discharge Instructions (Signed)
Please read and follow all provided instructions.  Your diagnoses today include:  1. Ankle sprain, left, initial encounter     Tests performed today include:  An x-ray of your ankle - does NOT show any broken bones  Vital signs. See below for your results today.   Medications prescribed:   Ibuprofen (Motrin, Advil) - anti-inflammatory pain medication  Do not exceed 600mg  ibuprofen every 6 hours, take with food  You have been prescribed an anti-inflammatory medication or NSAID. Take with food. Take smallest effective dose for the shortest duration needed for your pain. Stop taking if you experience stomach pain or vomiting.   Take any prescribed medications only as directed.  Home care instructions:   Follow any educational materials contained in this packet  Follow R.I.C.E. Protocol:  R - rest your injury   I  - use ice on injury without applying directly to skin  C - compress injury with bandage or splint  E - elevate the injury as much as possible  Follow-up instructions: Please follow-up with your primary care provider or the provided orthopedic (bone specialist) if you continue to have significant pain or trouble walking in 1 week. In this case you may have a severe sprain that requires further care.   Return instructions:   Please return if your toes are numb or tingling, appear gray or blue, or you have severe pain (also elevate leg and loosen splint or wrap)  Please return to the Emergency Department if you experience worsening symptoms.   Please return if you have any other emergent concerns.  Additional Information:  Your vital signs today were: BP 137/81 mmHg   Pulse 64   Temp(Src) 98.3 F (36.8 C) (Oral)   Resp 16   SpO2 100% If your blood pressure (BP) was elevated above 135/85 this visit, please have this repeated by your doctor within one month. -------------- Your caregiver has diagnosed you as suffering from an ankle sprain. Ankle sprain occurs  when the ligaments that hold the ankle joint together are stretched or torn. It may take 4 to 6 weeks to heal.  For Activity: If prescribed crutches, use crutches with non-weight bearing for the first few days. Then, you may walk on your ankle as the pain allows, or as instructed. Start gradually with weight bearing on the affected ankle. Once you can walk pain free, then try jogging. When you can run forwards, then you can try moving side-to-side. If you cannot walk without crutches in one week, you need a re-check. --------------

## 2015-10-20 NOTE — ED Notes (Signed)
Pt presents from home with c/o left ankle injury -- reports "rolling" ankle yesterday while at work. Edema and redness noted to left ankle. Pulses present, area is warm to touch, cap refill <3sec.
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
## 2018-04-13 ENCOUNTER — Other Ambulatory Visit: Payer: Self-pay

## 2018-04-13 ENCOUNTER — Encounter: Payer: Self-pay | Admitting: Physician Assistant

## 2018-04-13 ENCOUNTER — Ambulatory Visit: Payer: 59 | Admitting: Physician Assistant

## 2018-04-13 ENCOUNTER — Ambulatory Visit (INDEPENDENT_AMBULATORY_CARE_PROVIDER_SITE_OTHER): Payer: 59

## 2018-04-13 VITALS — BP 118/72 | HR 60 | Temp 98.2°F | Resp 16 | Ht 68.5 in | Wt 184.4 lb

## 2018-04-13 DIAGNOSIS — R05 Cough: Secondary | ICD-10-CM | POA: Diagnosis not present

## 2018-04-13 DIAGNOSIS — R059 Cough, unspecified: Secondary | ICD-10-CM

## 2018-04-13 DIAGNOSIS — R61 Generalized hyperhidrosis: Secondary | ICD-10-CM | POA: Diagnosis not present

## 2018-04-13 DIAGNOSIS — R0981 Nasal congestion: Secondary | ICD-10-CM | POA: Diagnosis not present

## 2018-04-13 DIAGNOSIS — J069 Acute upper respiratory infection, unspecified: Secondary | ICD-10-CM | POA: Diagnosis not present

## 2018-04-13 LAB — POCT CBC
Granulocyte percent: 68 % (ref 37–80)
HCT, POC: 48.9 % (ref 43.5–53.7)
Hemoglobin: 16 g/dL (ref 14.1–18.1)
Lymph, poc: 2.1 (ref 0.6–3.4)
MCH, POC: 30.2 pg (ref 27–31.2)
MCHC: 32.7 g/dL (ref 31.8–35.4)
MCV: 92.5 fL (ref 80–97)
MID (cbc): 0.5 (ref 0–0.9)
MPV: 8.2 fL (ref 0–99.8)
POC Granulocyte: 5.4 (ref 2–6.9)
POC LYMPH PERCENT: 26 % (ref 10–50)
POC MID %: 6 % (ref 0–12)
Platelet Count, POC: 285 10*3/uL (ref 142–424)
RBC: 5.29 M/uL (ref 4.69–6.13)
RDW, POC: 12.9 %
WBC: 8 10*3/uL (ref 4.6–10.2)

## 2018-04-13 MED ORDER — BENZONATATE 100 MG PO CAPS: 100.0000 mg | ORAL_CAPSULE | Freq: Three times a day (TID) | ORAL | 0 refills | Status: DC | PRN

## 2018-04-13 MED ORDER — PREDNISONE 20 MG PO TABS: 40.0000 mg | ORAL_TABLET | Freq: Every day | ORAL | 0 refills | Status: AC

## 2018-04-13 MED ORDER — LORATADINE-PSEUDOEPHEDRINE ER 5-120 MG PO TB12: 1.0000 | ORAL_TABLET | Freq: Two times a day (BID) | ORAL | 1 refills | Status: DC

## 2018-04-13 MED ORDER — FLUTICASONE PROPIONATE 50 MCG/ACT NA SUSP: 2.0000 | Freq: Every day | NASAL | 12 refills | Status: DC

## 2018-04-13 NOTE — Patient Instructions (Addendum)
Take Prednisone - 46m in the morning with breakfast for 5 days. This will reduce inflammation.  Start using Flonase - 2 sprays each nostril in the morning and at night 30 minutes before bed.  Start taking Claritin-D twice daily for the next 1-2 months. This is a great allergy medication. You can buy this from your pharmacist.   Tessalon (benzonatate) is for cough. Take this as needed to help you stop coughing.  Stay well hydrated. Drink 64 oz water daily.   Please come back and see me in 7-10 days if you are not improving.   Do not use Afrin for longer than 3-4 days in a row.   Stay well hydrated. Get lost of rest. Wash your hands often.   -Foods that can help speed recovery: honey, garlic, chicken soup, elderberries, green tea.  -Supplements that can help speed recovery: vitamin C, zinc, elderberry extract, quercetin, ginseng, selenium -Supplement with prebiotics and probiotics:   Advil or ibuprofen for pain. Do not take Aspirin.  Drink enough water and fluids to keep your urine clear or pale yellow.  For sore throat: ? Gargle with 8 oz of salt water ( tsp of salt per 1 qt of water) as often as every 1-2 hours to soothe your throat.  Gargle liquid benadryl.  Cepacol throat lozenges (if you are not at risk for choking).  For sore throat try using a honey-based tea. Use 3 teaspoons of honey with juice squeezed from half lemon. Place shaved pieces of ginger into 1/2-1 cup of water and warm over stove top. Then mix the ingredients and repeat every 4 hours as needed.  Cough Syrup Recipe: Sweet Lemon & Honey Thyme  Ingredients a handful of fresh thyme sprigs   1 pint of water (2 cups)  1/2 cup honey (raw is best, but regular will do)  1/2 lemon chopped Instructions 1. Place the lemon in the pint jar and cover with the honey. The honey will macerate the lemons and draw out liquids which taste so delicious! 2. Meanwhile, toss the thyme leaves into a saucepan and cover them with the  water. 3. Bring the water to a gentle simmer and reduce it to half, about a cup of tea. 4. When the tea is reduced and cooled a bit, strain the sprigs & leaves, add it into the pint jar and stir it well. 5. Give it a shake and use a spoonful as needed. 6. Store your homemade cough syrup in the refrigerator for about a month.  What causes a cough? In adults, common causes of a cough include: ?An infection of the airways or lungs (such as the common cold) ?Postnasal drip - Postnasal drip is when mucus from the nose drips down or flows along the back of the throat. Postnasal drip can happen when people have: .A cold .Allergies .A sinus infection - The sinuses are hollow areas in the bones of the face that open into the nose. ?Lung conditions, like asthma and chronic obstructive pulmonary disease (COPD) - Both of these conditions can make it hard to breathe. COPD is usually caused by smoking. ?Acid reflux - Acid reflux is when the acid that is normally in your stomach backs up into your esophagus (the tube that carries food from your mouth to your stomach). ?A side effect from blood pressure medicines called "ACE inhibitors" ?Smoking cigarettes  Is there anything I can do on my own to get rid of my cough? Yes. To help get rid of your  cough, you can: ?Use a humidifier in your bedroom ?Use an over-the-counter cough medicine, or suck on cough drops or hard candy ?Stop smoking, if you smoke ?If you have allergies, avoid the things you are allergic to (like pollen, dust, animals, or mold) If you have acid reflux, your doctor or nurse will tell you which lifestyle changes can help reduce symptoms.    Thank you for coming in today. I hope you feel we met your needs.  Feel free to call PCP if you have any questions or further requests.  Please consider signing up for MyChart if you do not already have it, as this is a great way to communicate with me.  Best,  Whitney McVey, PA-C    IF you  received an x-ray today, you will receive an invoice from [facility]. Please contact [facility] at [phone] with questions or concerns regarding your invoice.   IF you received labwork today, you will receive an invoice from [facility]. Please contact [facility] at [phone] with questions or concerns regarding your invoice.   Our billing staff will not be able to assist you with questions regarding bills from these companies.  You will be contacted with the lab results as soon as they are available. The fastest way to get your results is to activate your My Chart account. Instructions are located on the last page of this paperwork. If you have not heard from [place] regarding the results in 2 weeks, please contact this office.

## 2018-04-13 NOTE — Progress Notes (Signed)
Jonathan HamsJulian Wolf  MRN: [id number] DOB: 10/14/1976  PCP: Patient, No Pcp Per  Subjective:  Pt is a 42 year old male PMH SBO who presents to clinic for cough x 3 weeks.  Cold with congestion then started having sweats which has worsened. C/o sweats and chills at night. Some itching in the top of the throat. +HA Symptoms are worsening. Endorses night sweats.   He is taking naproxen and claritin, which is not helping.  He is not drinking much water.  He is a current every day smoker. Smokes about 1/3 pack per day.   Review of Systems  Constitutional: Positive for diaphoresis. Negative for chills, fatigue and fever.  HENT: Positive for congestion and rhinorrhea. Negative for postnasal drip, sinus pressure, sinus pain, sneezing and sore throat.   Respiratory: Positive for cough. Negative for shortness of breath and wheezing.     Patient Active Problem List   Diagnosis Date Noted  . SBO (small bowel obstruction) (HCC) 12/29/2014    Current Outpatient Medications on File Prior to Visit  Medication Sig Dispense Refill  . loratadine (CLARITIN) 10 MG tablet Take 10 mg by mouth daily.     No current facility-administered medications on file prior to visit.     No Known Allergies   Objective:  BP 118/72   Pulse 60   Temp 98.2 F (36.8 C) (Oral)   Resp 16   Ht 5' 8.5" (1.74 m)   Wt 184 lb 6.4 oz (83.6 kg)   SpO2 100%   BMI 27.63 kg/m   Physical Exam  Constitutional: He is oriented to person, place, and time. He appears well-developed and well-nourished.  HENT:  Right Ear: Tympanic membrane normal.  Left Ear: Tympanic membrane normal.  Cardiovascular: Normal rate and regular rhythm.  Pulmonary/Chest: Effort normal. No respiratory distress. He has no wheezes. He has no rales.  Neurological: He is alert and oriented to person, place, and time.  Skin: Skin is warm and dry.  Psychiatric: He has a normal mood and affect. His behavior is normal. Judgment and thought content  normal.  Vitals reviewed.   Dg Chest 2 View  Result Date: 04/13/2018 CLINICAL DATA:  Worsening cough, night sweats EXAM: CHEST - 2 VIEW COMPARISON:  None. FINDINGS: Heart and mediastinal contours are within normal limits. No focal opacities or effusions. No acute bony abnormality. IMPRESSION: No active cardiopulmonary disease. Electronically Signed   By: Charlett NoseKevin  Dover M.D.   On: 04/13/2018 12:19   Results for orders placed or performed in visit on 04/13/18  POCT CBC  Result Value Ref Range   WBC 8.0 4.6 - 10.2 K/uL   Lymph, poc 2.1 0.6 - 3.4   POC LYMPH PERCENT 26.0 10 - 50 %L   MID (cbc) 0.5 0 - 0.9   POC MID % 6.0 0 - 12 %M   POC Granulocyte 5.4 2 - 6.9   Granulocyte percent 68.0 37 - 80 %G   RBC 5.29 4.69 - 6.13 M/uL   Hemoglobin 16.0 14.1 - 18.1 g/dL   HCT, POC 40.948.9 81.143.5 - 53.7 %   MCV 92.5 80 - 97 fL   MCH, POC 30.2 27 - 31.2 pg   MCHC 32.7 31.8 - 35.4 g/dL   RDW, POC 91.412.9 %   Platelet Count, POC 285 142 - 424 K/uL   MPV 8.2 0 - 99.8 fL    Assessment and Plan :  1. Acute upper respiratory infection 2. Cough - DG Chest 2 View; Future -  predniSONE (DELTASONE) 20 MG tablet; Take 2 tablets (40 mg total) by mouth daily with breakfast for 5 days.  Dispense: 10 tablet; Refill: 0 - benzonatate (TESSALON) 100 MG capsule; Take 1-2 capsules (100-200 mg total) by mouth 3 (three) times daily as needed for cough.  Dispense: 40 capsule; Refill: 0 - Pt presents c/o cough x 3 weeks. WBC count wnl, chest x-ray is negative and vitals are stable. Suspect URI, plan to treat supportively. RTC in no improvement in 5-7 days. He understands and agrees with plan.  3. Diaphoresis - POCT CBC  4. Nasal congestion - loratadine-pseudoephedrine (CLARITIN-D 12 HOUR) 5-120 MG tablet; Take 1 tablet by mouth 2 (two) times daily.  Dispense: 30 tablet; Refill: 1 - fluticasone (FLONASE) 50 MCG/ACT nasal spray; Place 2 sprays into both nostrils daily.  Dispense: 16 g; Refill: 12   Whitney Abria Vannostrand, PA-C    [facility] at [facility] 04/13/2018 12:07 PM

## 2018-05-29 ENCOUNTER — Encounter: Payer: 59 | Admitting: Urgent Care

## 2018-05-30 ENCOUNTER — Ambulatory Visit (INDEPENDENT_AMBULATORY_CARE_PROVIDER_SITE_OTHER): Payer: 59 | Admitting: Urgent Care

## 2018-05-30 ENCOUNTER — Other Ambulatory Visit: Payer: Self-pay

## 2018-05-30 ENCOUNTER — Encounter: Payer: Self-pay | Admitting: Urgent Care

## 2018-05-30 VITALS — BP 120/72 | HR 58 | Temp 98.2°F | Resp 16 | Ht 68.0 in | Wt 178.1 lb

## 2018-05-30 DIAGNOSIS — Z1329 Encounter for screening for other suspected endocrine disorder: Secondary | ICD-10-CM

## 2018-05-30 DIAGNOSIS — Z87828 Personal history of other (healed) physical injury and trauma: Secondary | ICD-10-CM | POA: Diagnosis not present

## 2018-05-30 DIAGNOSIS — Z23 Encounter for immunization: Secondary | ICD-10-CM | POA: Diagnosis not present

## 2018-05-30 DIAGNOSIS — Z13220 Encounter for screening for lipoid disorders: Secondary | ICD-10-CM | POA: Diagnosis not present

## 2018-05-30 DIAGNOSIS — Z114 Encounter for screening for human immunodeficiency virus [HIV]: Secondary | ICD-10-CM

## 2018-05-30 DIAGNOSIS — R3989 Other symptoms and signs involving the genitourinary system: Secondary | ICD-10-CM

## 2018-05-30 DIAGNOSIS — Z13228 Encounter for screening for other metabolic disorders: Secondary | ICD-10-CM | POA: Diagnosis not present

## 2018-05-30 DIAGNOSIS — Z0000 Encounter for general adult medical examination without abnormal findings: Secondary | ICD-10-CM | POA: Diagnosis not present

## 2018-05-30 DIAGNOSIS — IMO0002 Reserved for concepts with insufficient information to code with codable children: Secondary | ICD-10-CM

## 2018-05-30 DIAGNOSIS — Z130 Encounter for screening for diseases of the blood and blood-forming organs and certain disorders involving the immune mechanism: Secondary | ICD-10-CM | POA: Diagnosis not present

## 2018-05-30 LAB — POCT URINALYSIS DIP (MANUAL ENTRY)
Bilirubin, UA: NEGATIVE
Blood, UA: NEGATIVE
GLUCOSE UA: NEGATIVE mg/dL
Ketones, POC UA: NEGATIVE mg/dL
LEUKOCYTES UA: NEGATIVE
Nitrite, UA: NEGATIVE
Protein Ur, POC: NEGATIVE mg/dL
SPEC GRAV UA: 1.025 (ref 1.010–1.025)
Urobilinogen, UA: 0.2 E.U./dL
pH, UA: 6 (ref 5.0–8.0)

## 2018-05-30 NOTE — Patient Instructions (Signed)
Mantenimiento de la salud en los hombres (Health Maintenance, Male) Un estilo de vida saludable y los cuidados preventivos son importantes para la salud y el bienestar. Pregntele al mdico cul es el cronograma de exmenes peridicos adecuado para usted. QU DEBO SABER SOBRE EL PESO Y LA DIETA? Consuma una dieta saludable  Coma muchas verduras, frutas, cereales integrales, productos lcteos con bajo contenido de grasa y protenas magras.  No consuma muchos alimentos de alto contenido de grasas slidas, azcares agregados o sal. Mantenga un peso saludable La actividad fsica habitual puede ayudarlo a alcanzar o mantener un peso saludable. Deber hacer lo siguiente:  Realizar al menos 150minutos de actividad fsica por semana. El ejercicio debe aumentar la frecuencia cardaca y provocar la transpiracin (ejercicio de intensidad moderada).  Hacer ejercicios de entrenamiento de fuerza por lo menos dos veces por semana. Controlarse los niveles de colesterol y lpidos en la sangre  Hgase anlisis de sangre para controlar los lpidos y el colesterol cada 5aos a partir de los 35aos. Si tiene un riesgo alto de tener cardiopatas coronarias, debe comenzar a hacerse anlisis de sangre a los 20aos. Es posible que necesite controlar los niveles de colesterol con mayor frecuencia si: ? Sus niveles de lpidos y colesterol son altos. ? Es mayor de 50aos. ? Tiene un riesgo alto de tener cardiopatas coronarias. QU DEBO SABER SOBRE LAS PRUEBAS DE DETECCIN DEL CNCER? Muchos tipos de cncer se pueden detectar de manera temprana y a menudo prevenirse. Cncer de pulmn  Debe someterse a pruebas de deteccin de cncer de pulmn todos los aos en los siguientes casos: ? Si fuma actualmente y lo ha hecho durante por lo menos 30aos. ? Si fue fumador que dej el hbito en el trmino de los ltimos 15aos.  Hable con el mdico sobre las opciones en relacin con los estudios de deteccin, cundo  debe comenzar a hacrselos y con qu frecuencia. Cncer colorrectal  Generalmente, las pruebas de deteccin habituales del cncer colorrectal comienzan a los 50aos y deben repetirse cada 5 a 10aos hasta los 75aos. Es posible que tenga que hacerse las pruebas con mayor frecuencia si se detectan formas tempranas de plipos precancerosos o pequeos bultos. Sin embargo, el mdico podr aconsejarle que lo haga antes, si tiene factores de riesgo para el cncer de colon.  El mdico puede recomendarle que use kits de prueba caseros para hallar sangre oculta en la materia fecal.  Se puede usar una pequea cmara en el extremo de un tubo para examinar el colon (sigmoidoscopia o colonoscopia). Este estudio detecta las formas ms tempranas de cncer colorrectal. Cncer de prstata y de testculo  En funcin de la edad y del estado de salud general, el mdico puede realizarle determinados estudios de deteccin del cncer de prstata y de testculo.  Hable con el mdico sobre cualquier sntoma o acerca de las inquietudes que tenga sobre el cncer de prstata o de testculo. Cncer de piel  Revise la piel de la cabeza a los pies con regularidad.  Informe al mdico si aparecen nuevos lunares o si nota cambios en los que ya tiene, especialmente en estos casos: ? Si hay un cambio en el tamao, la forma o el color del lunar. ? Si tiene un lunar que es ms grande que el tamao de una goma de lpiz.  Siempre use pantalla solar. Aplquese pantalla solar de manera generosa y repetida a lo largo del da.  Use mangas y pantalones largos, un sombrero de ala ancha y gafas para   el sol cuando est al aire libre, para protegerse. QU DEBO SABER SOBRE LAS CARDIOPATAS CORONARIAS, LA DIABETES Y LA HIPERTENSIN ARTERIAL?  Si usted tiene entre 18 y 39aos, debe medirse la presin arterial cada 3a 5aos. Si usted tiene 40aos o ms, debe medirse la presin arterial todos los aos. Debe medirse la presin arterial  dos veces: una vez cuando est en un hospital o una clnica y la otra vez cuando est en otro sitio. Registre el promedio de las dos mediciones. Para controlar su presin arterial cuando no est en un hospital o una clnica, puede usar lo siguiente: ? Una mquina automtica para medir la presin arterial en una farmacia. ? Un monitor para medir la presin arterial en el hogar.  Hable con el mdico sobre los valores ideales de la presin arterial.  Si tiene entre 45 y 79aos, consltele al mdico si debe tomar aspirina para evitar las cardiopatas coronarias.  Hgase anlisis habituales de deteccin de la diabetes; para ello, contrlese la glucemia en ayunas. ? Si su peso es normal y tiene un bajo riesgo de padecer diabetes, realcese este anlisis cada tres aos despus de los 45aos. ? Si tiene sobrepeso y un alto riesgo de padecer diabetes, considere someterse a este anlisis antes o con mayor frecuencia.  Para los hombres que tienen entre 65 y 75aos, y son o han sido fumadores, se recomienda un nico estudio con ecografa para detectar un aneurisma de aorta abdominal (AAA). QU DEBO SABER SOBRE LA PREVENCIN DE LAS INFECCIONES? HepatitisB Si tiene un riesgo ms alto de contraer hepatitis B, debe someterse a un examen de deteccin de este virus. Hable con el mdico para determinar si corre riesgo de tener una infeccin por hepatitisB. Hepatitis C Se recomienda un anlisis de sangre para:  Todos los que nacieron entre 1945 y 1965.  Todas las personas que tengan un riesgo de haber contrado hepatitis C. Enfermedades de transmisin sexual (ETS)  Debe realizarse pruebas de deteccin de las ETS todos los aos, incluidas la gonorrea y la clamidia, en estos casos: ? Es sexualmente activo y es menor de 24aos. ? Es mayor de 24aos, y el mdico le informa que corre riesgo de tener este tipo de infecciones. ? La actividad sexual ha cambiado desde que le hicieron la ltima prueba de  deteccin y tiene un riesgo mayor de tener clamidia o gonorrea. Pregntele al mdico si usted tiene riesgo.  Consulte a su mdico para saber si tiene un alto riesgo de infectarse por el VIH. El mdico puede recomendarle un medicamento de venta con receta para ayudar a evitar la infeccin por el VIH. QU MS PUEDO HACER?  Realcese los estudios de rutina de la salud, dentales y de la vista.  Mantngase al da con las vacunas (inmunizaciones).  No consuma ningn producto que contenga tabaco, lo que incluye cigarrillos, tabaco de mascar y cigarrillos electrnicos. Si necesita ayuda para dejar de fumar, consulte al mdico.  Limite el consumo de alcohol a no ms de 2medidas por da. Una medida equivale a 12 onzas de cerveza, 5onzas de vino o 1onzas de bebidas alcohlicas de alta graduacin.  No consuma drogas.  No comparta agujas.  Solicite ayuda a su mdico si necesita apoyo o informacin para abandonar las drogas.  Informe a su mdico si a menudo se siente deprimido.  Notifique a su mdico si alguna vez ha sido vctima de abuso o si no se siente seguro en su hogar. Esta informacin no tiene como fin reemplazar el   consejo del mdico. Asegrese de hacerle al mdico cualquier pregunta que tenga. Document Released: 06/13/2008 Document Revised: 01/06/2015 Document Reviewed: 09/19/2015 Elsevier Interactive Patient Education  2018 [employer]  
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
## 2018-05-31 LAB — COMPREHENSIVE METABOLIC PANEL
A/G RATIO: 1.8 (ref 1.2–2.2)
ALK PHOS: 111 IU/L (ref 39–117)
ALT: 10 IU/L (ref 0–44)
AST: 16 IU/L (ref 0–40)
Albumin: 4.6 g/dL (ref 3.5–5.5)
BILIRUBIN TOTAL: 0.6 mg/dL (ref 0.0–1.2)
BUN/Creatinine Ratio: 16 (ref 9–20)
BUN: 13 mg/dL (ref 6–24)
CHLORIDE: 103 mmol/L (ref 96–106)
CO2: 22 mmol/L (ref 20–29)
Calcium: 8.7 mg/dL (ref 8.7–10.2)
Creatinine, Ser: 0.81 mg/dL (ref 0.76–1.27)
GFR calc Af Amer: 128 mL/min/{1.73_m2} (ref >59)
GFR calc non Af Amer: 110 mL/min/{1.73_m2} (ref >59)
Globulin, Total: 2.6 g/dL (ref 1.5–4.5)
Glucose: 83 mg/dL (ref 65–99)
POTASSIUM: 4.4 mmol/L (ref 3.5–5.2)
Sodium: 141 mmol/L (ref 134–144)
Total Protein: 7.2 g/dL (ref 6.0–8.5)

## 2018-05-31 LAB — HEMOGLOBIN A1C
ESTIMATED AVERAGE GLUCOSE: 100 mg/dL
Hgb A1c MFr Bld: 5.1 % (ref 4.8–5.6)

## 2018-05-31 LAB — LIPID PANEL
CHOL/HDL RATIO: 3.2 ratio (ref 0.0–5.0)
Cholesterol, Total: 137 mg/dL (ref 100–199)
HDL: 43 mg/dL (ref >39)
LDL Calculated: 73 mg/dL (ref 0–99)
TRIGLYCERIDES: 106 mg/dL (ref 0–149)
VLDL Cholesterol Cal: 21 mg/dL (ref 5–40)

## 2018-05-31 LAB — CBC
Hematocrit: 47.4 % (ref 37.5–51.0)
Hemoglobin: 15.7 g/dL (ref 13.0–17.7)
MCH: 30.5 pg (ref 26.6–33.0)
MCHC: 33.1 g/dL (ref 31.5–35.7)
MCV: 92 fL (ref 79–97)
PLATELETS: 256 10*3/uL (ref 150–450)
RBC: 5.14 x10E6/uL (ref 4.14–5.80)
RDW: 13.5 % (ref 12.3–15.4)
WBC: 6.7 10*3/uL (ref 3.4–10.8)

## 2018-05-31 LAB — TSH: TSH: 2.42 u[IU]/mL (ref 0.450–4.500)

## 2018-05-31 LAB — HIV ANTIBODY (ROUTINE TESTING W REFLEX): HIV SCREEN 4TH GENERATION: NONREACTIVE

## 2018-10-16 IMAGING — DX DG CHEST 2V
2 series · 2 of 2 positions shown · non-contrast
Comparison: None.

CLINICAL DATA: Worsening cough, night sweats

EXAM:
CHEST - 2 VIEW

[chest pa]
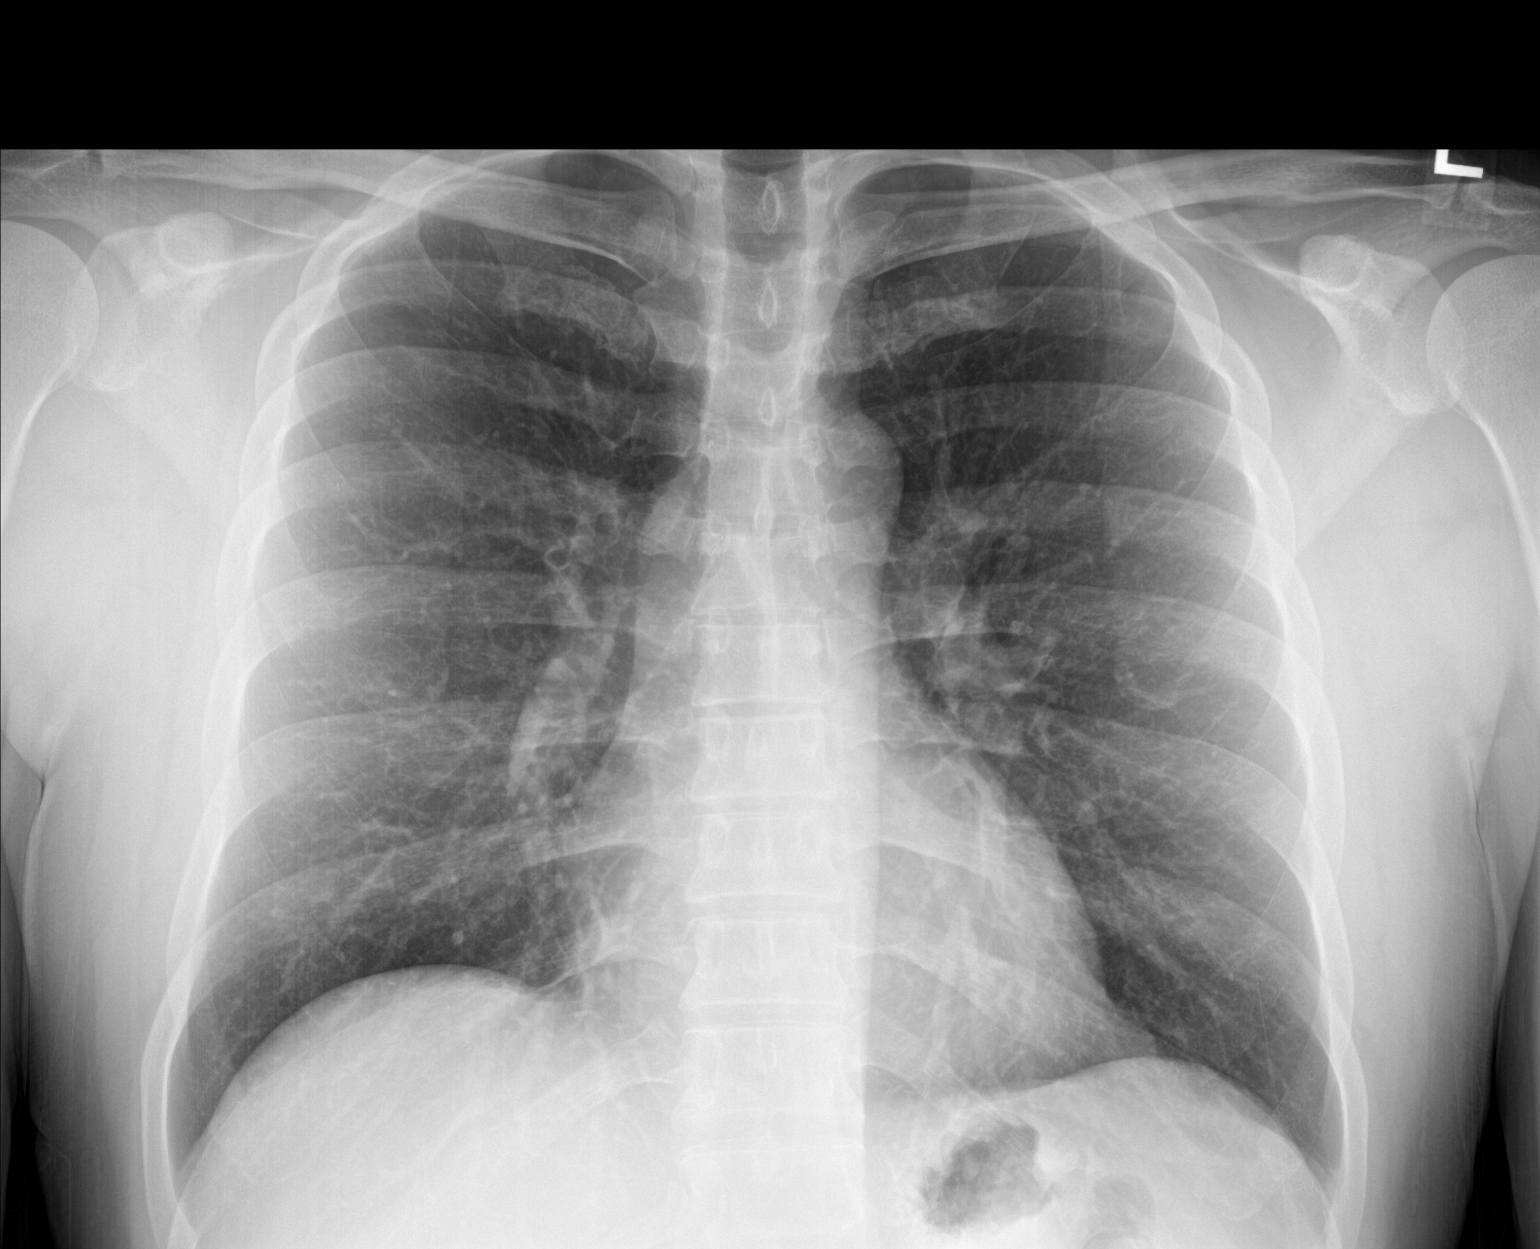

[chest lat]
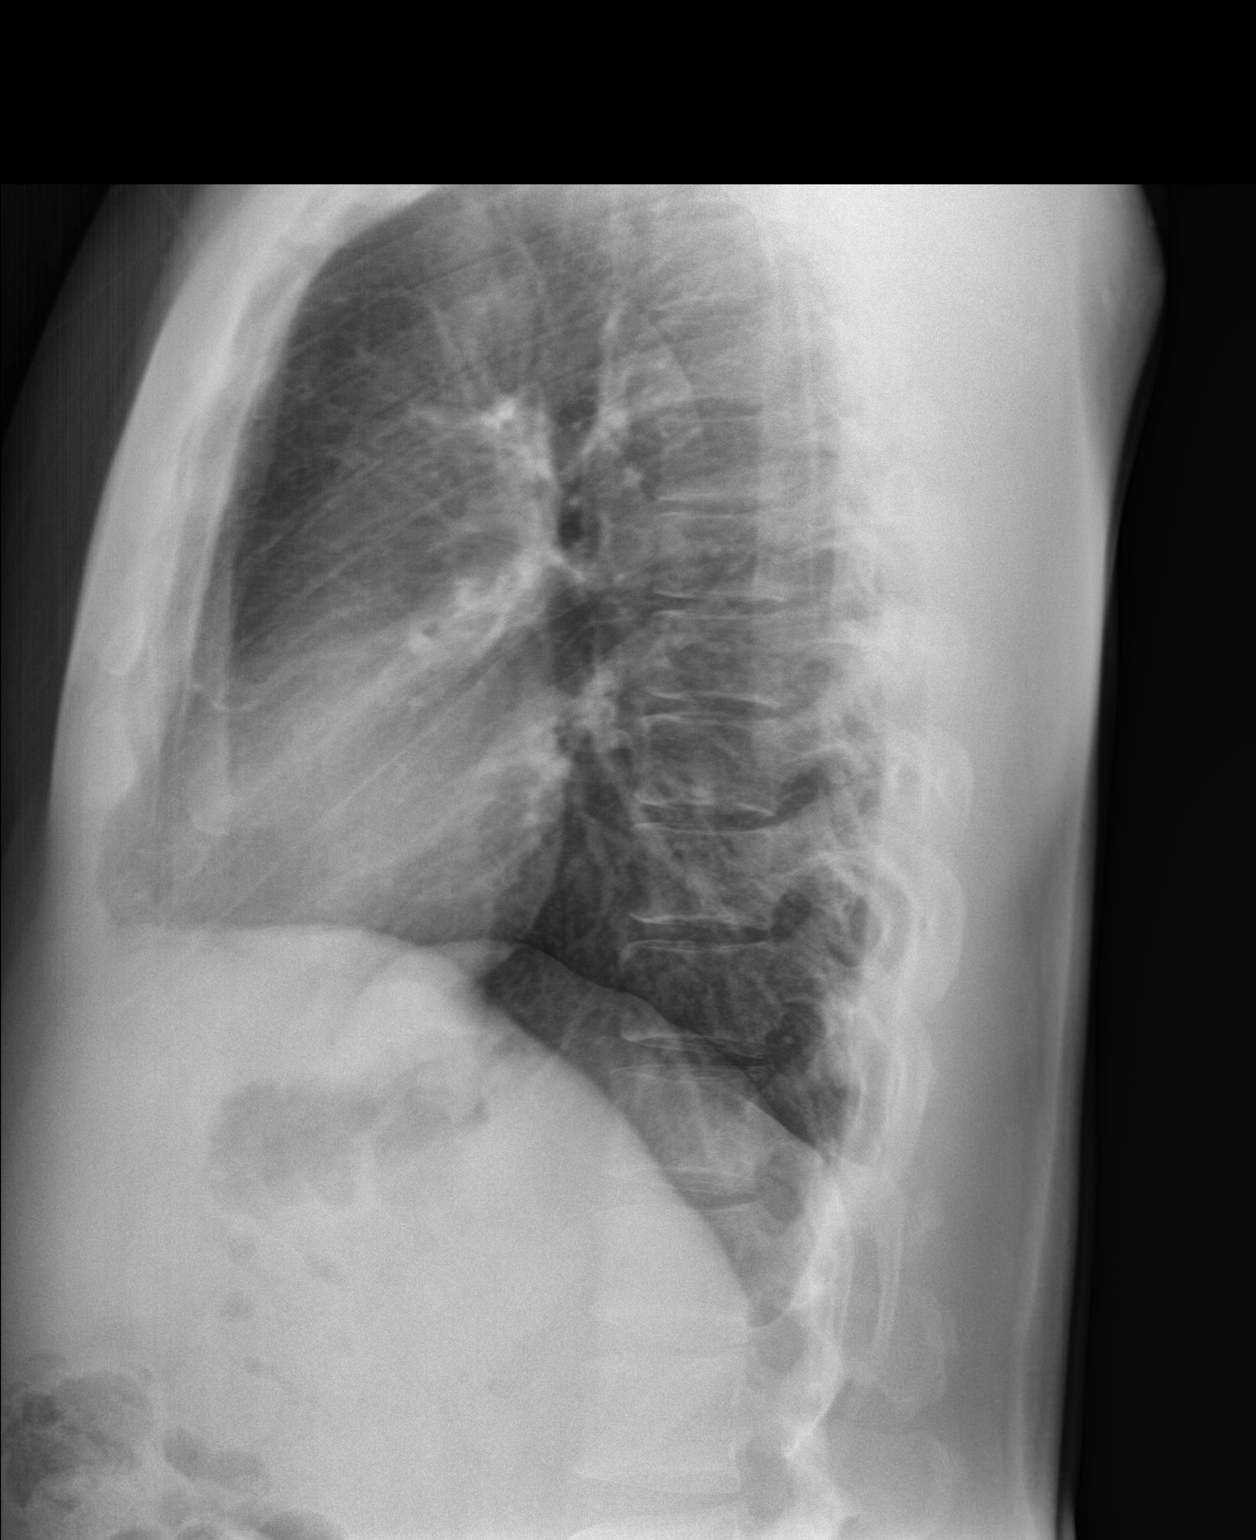

[2 of 2 positions shown; findings below may reference images not displayed]

FINDINGS: Heart and mediastinal contours are within normal limits. No focal
opacities or effusions. No acute bony abnormality.
IMPRESSION: No active cardiopulmonary disease.

## 2018-11-14 ENCOUNTER — Ambulatory Visit: Payer: 59 | Admitting: Emergency Medicine

## 2018-11-14 ENCOUNTER — Encounter: Payer: Self-pay | Admitting: Emergency Medicine

## 2018-11-14 ENCOUNTER — Other Ambulatory Visit: Payer: Self-pay

## 2018-11-14 VITALS — BP 137/92 | HR 60 | Temp 98.0°F | Resp 16 | Ht 68.5 in | Wt 182.0 lb

## 2018-11-14 DIAGNOSIS — R399 Unspecified symptoms and signs involving the genitourinary system: Secondary | ICD-10-CM

## 2018-11-14 DIAGNOSIS — R351 Nocturia: Secondary | ICD-10-CM | POA: Insufficient documentation

## 2018-11-14 DIAGNOSIS — R350 Frequency of micturition: Secondary | ICD-10-CM

## 2018-11-14 LAB — POCT URINALYSIS DIP (MANUAL ENTRY)
Bilirubin, UA: NEGATIVE
Blood, UA: NEGATIVE
Glucose, UA: NEGATIVE mg/dL
Ketones, POC UA: NEGATIVE mg/dL
Leukocytes, UA: NEGATIVE
Nitrite, UA: NEGATIVE
Protein Ur, POC: NEGATIVE mg/dL
Spec Grav, UA: 1.02
Urobilinogen, UA: 0.2 U/dL
pH, UA: 6

## 2018-11-14 NOTE — Patient Instructions (Addendum)
     If you have lab work done today you will be contacted with your lab results within the next 2 weeks.  If you have not heard from us then please contact us. The fastest way to get your results is to register for My Chart.   IF you received an x-ray today, you will receive an invoice from [facility]. Please contact [facility] at [phone] with questions or concerns regarding your invoice.   IF you received labwork today, you will receive an invoice from [facility]. Please contact [facility] at [phone] with questions or concerns regarding your invoice.   Our billing staff will not be able to assist you with questions regarding bills from these companies.  You will be contacted with the lab results as soon as they are available. The fastest way to get your results is to activate your My Chart account. Instructions are located on the last page of this paperwork. If you have not heard from us regarding the results in 2 weeks, please contact this office.      Pelvic Pain, Male Pelvic pain is pain in your lower abdomen, below your belly button and between your hips. The pain may start suddenly (acute), keep coming back (recurring), or last a long time (chronic). Pelvic pain that lasts longer than six months is considered chronic. Pelvic pain may affect your:  Prostate gland.  Urinary system.  Digestive tract.  Musculoskeletal system.  There are many potential causes of pelvic pain. Sometimes, the pain can be a result of prostate, urinary, or digestive conditions. Strained muscles or ligaments may also cause pelvic pain. Sometimes, the cause is not known. Follow these instructions at home:  Take over-the-counter and prescription medicines only as told by your health care provider.  Rest as told by your health care provider.  Keep a journal of your pelvic pain. Write down: ? When the pain started. ? Where the pain is located. ? What seems to make the pain  better or worse. ? Any symptoms you have along with the pain.  Keep all follow-up visits as told by your health care provider. This is important. Contact a health care provider if:  Medicine does not help your pain.  Your pain comes back.  You have new symptoms.  You have a fever or chills.  You are constipated.  You have blood in your urine or stool.  You feel weak or lightheaded. Get help right away if:  You have sudden severe pain.  Your pain steadily gets worse.  You have severe pain along with fever, nausea, vomiting, or excessive sweating.  You lose consciousness. This information is not intended to replace advice given to you by your health care provider. Make sure you discuss any questions you have with your health care provider. Document Released: 09/09/2012 Document Revised: 01/10/2016 Document Reviewed: 10/06/2015 Elsevier Interactive Patient Education  [employer]2018 Elsevier Inc.
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
## 2018-11-15 LAB — CBC WITH DIFFERENTIAL/PLATELET
BASOS: 1 %
Basophils Absolute: 0 10*3/uL (ref 0.0–0.2)
EOS (ABSOLUTE): 0.4 10*3/uL (ref 0.0–0.4)
EOS: 8 %
HEMATOCRIT: 46.3 % (ref 37.5–51.0)
HEMOGLOBIN: 16.1 g/dL (ref 13.0–17.7)
Immature Grans (Abs): 0 10*3/uL (ref 0.0–0.1)
Immature Granulocytes: 0 %
Lymphocytes Absolute: 2.2 10*3/uL (ref 0.7–3.1)
Lymphs: 41 %
MCH: 31.4 pg (ref 26.6–33.0)
MCHC: 34.8 g/dL (ref 31.5–35.7)
MCV: 90 fL (ref 79–97)
MONOCYTES: 8 %
MONOS ABS: 0.5 10*3/uL (ref 0.1–0.9)
NEUTROS PCT: 42 %
Neutrophils Absolute: 2.2 10*3/uL (ref 1.4–7.0)
Platelets: 267 10*3/uL (ref 150–450)
RBC: 5.13 x10E6/uL (ref 4.14–5.80)
RDW: 12.5 % (ref 12.3–15.4)
WBC: 5.4 10*3/uL (ref 3.4–10.8)

## 2018-11-15 LAB — COMPREHENSIVE METABOLIC PANEL
A/G RATIO: 1.8 (ref 1.2–2.2)
ALBUMIN: 4.6 g/dL (ref 3.5–5.5)
ALT: 13 IU/L (ref 0–44)
AST: 17 IU/L (ref 0–40)
Alkaline Phosphatase: 114 IU/L (ref 39–117)
BUN / CREAT RATIO: 15 (ref 9–20)
BUN: 11 mg/dL (ref 6–24)
Bilirubin Total: 0.6 mg/dL (ref 0.0–1.2)
CALCIUM: 9.4 mg/dL (ref 8.7–10.2)
CO2: 23 mmol/L (ref 20–29)
Chloride: 100 mmol/L (ref 96–106)
Creatinine, Ser: 0.75 mg/dL — ABNORMAL LOW (ref 0.76–1.27)
GFR, EST AFRICAN AMERICAN: 132 mL/min/{1.73_m2} (ref >59)
GFR, EST NON AFRICAN AMERICAN: 114 mL/min/{1.73_m2} (ref >59)
GLOBULIN, TOTAL: 2.6 g/dL (ref 1.5–4.5)
Glucose: 83 mg/dL (ref 65–99)
Potassium: 4.3 mmol/L (ref 3.5–5.2)
SODIUM: 141 mmol/L (ref 134–144)
Total Protein: 7.2 g/dL (ref 6.0–8.5)

## 2018-11-15 LAB — HEMOGLOBIN A1C
Est. average glucose Bld gHb Est-mCnc: 100 mg/dL
Hgb A1c MFr Bld: 5.1 % (ref 4.8–5.6)

## 2018-11-15 LAB — URINE CULTURE: Organism ID, Bacteria: NO GROWTH

## 2018-11-15 LAB — PSA: Prostate Specific Ag, Serum: 0.7 ng/mL (ref 0.0–4.0)

## 2018-11-16 ENCOUNTER — Encounter: Payer: Self-pay | Admitting: *Deleted
